# Patient Record
Sex: Female | Born: 1960
Health system: Southern US, Community
[De-identification: ages and names within clinical notes are randomized; demographics above are authoritative.]

## PROBLEM LIST (undated history)

## (undated) DIAGNOSIS — E079 Disorder of thyroid, unspecified: Secondary | ICD-10-CM

## (undated) DIAGNOSIS — E669 Obesity, unspecified: Secondary | ICD-10-CM

## (undated) DIAGNOSIS — E785 Hyperlipidemia, unspecified: Secondary | ICD-10-CM

## (undated) HISTORY — DX: Disorder of thyroid, unspecified: E07.9

## (undated) HISTORY — DX: Hyperlipidemia, unspecified: E78.5

## (undated) HISTORY — DX: Obesity, unspecified: E66.9

---

## 2000-12-10 ENCOUNTER — Other Ambulatory Visit: Admission: RE | Admit: 2000-12-10 | Discharge: 2000-12-10 | Payer: Self-pay | Admitting: Obstetrics and Gynecology

## 2001-06-19 ENCOUNTER — Emergency Department (HOSPITAL_COMMUNITY): Admission: EM | Admit: 2001-06-19 | Discharge: 2001-06-19 | Payer: Self-pay | Admitting: Emergency Medicine

## 2003-06-10 ENCOUNTER — Other Ambulatory Visit: Admission: RE | Admit: 2003-06-10 | Discharge: 2003-06-10 | Payer: Self-pay | Admitting: Obstetrics and Gynecology

## 2012-04-23 ENCOUNTER — Other Ambulatory Visit: Payer: Self-pay | Admitting: Family Medicine

## 2012-04-29 ENCOUNTER — Other Ambulatory Visit (HOSPITAL_COMMUNITY)
Admission: RE | Admit: 2012-04-29 | Discharge: 2012-04-29 | Disposition: A | Discharge status: Home / Self Care | Payer: 59 | Source: Ambulatory Visit | Attending: Family Medicine | Admitting: Family Medicine

## 2012-04-29 DIAGNOSIS — Z1159 Encounter for screening for other viral diseases: Secondary | ICD-10-CM | POA: Insufficient documentation

## 2012-04-29 DIAGNOSIS — Z124 Encounter for screening for malignant neoplasm of cervix: Secondary | ICD-10-CM | POA: Insufficient documentation

## 2012-08-10 ENCOUNTER — Other Ambulatory Visit (HOSPITAL_COMMUNITY): Payer: Self-pay | Admitting: Family Medicine

## 2012-08-10 DIAGNOSIS — Z1231 Encounter for screening mammogram for malignant neoplasm of breast: Secondary | ICD-10-CM

## 2012-08-27 ENCOUNTER — Ambulatory Visit (HOSPITAL_COMMUNITY)
Admission: RE | Admit: 2012-08-27 | Discharge: 2012-08-27 | Disposition: A | Discharge status: Home / Self Care | Payer: BC Managed Care – PPO | Source: Ambulatory Visit | Attending: Family Medicine | Admitting: Family Medicine

## 2012-08-27 DIAGNOSIS — Z1231 Encounter for screening mammogram for malignant neoplasm of breast: Secondary | ICD-10-CM

## 2012-09-01 ENCOUNTER — Other Ambulatory Visit: Payer: Self-pay | Admitting: Family Medicine

## 2012-09-01 DIAGNOSIS — R928 Other abnormal and inconclusive findings on diagnostic imaging of breast: Secondary | ICD-10-CM

## 2012-09-02 ENCOUNTER — Ambulatory Visit
Admission: RE | Admit: 2012-09-02 | Discharge: 2012-09-02 | Disposition: A | Discharge status: Home / Self Care | Payer: BC Managed Care – PPO | Source: Ambulatory Visit | Attending: Family Medicine | Admitting: Family Medicine

## 2012-09-02 ENCOUNTER — Other Ambulatory Visit: Payer: Self-pay | Admitting: Family Medicine

## 2012-09-02 DIAGNOSIS — R928 Other abnormal and inconclusive findings on diagnostic imaging of breast: Secondary | ICD-10-CM

## 2012-09-08 ENCOUNTER — Other Ambulatory Visit (HOSPITAL_COMMUNITY): Payer: Self-pay | Admitting: Radiology

## 2012-09-16 ENCOUNTER — Ambulatory Visit
Admission: RE | Admit: 2012-09-16 | Discharge: 2012-09-16 | Disposition: A | Discharge status: Home / Self Care | Payer: BC Managed Care – PPO | Source: Ambulatory Visit | Attending: Family Medicine | Admitting: Family Medicine

## 2012-09-16 DIAGNOSIS — R928 Other abnormal and inconclusive findings on diagnostic imaging of breast: Secondary | ICD-10-CM

## 2013-11-03 ENCOUNTER — Other Ambulatory Visit: Payer: Self-pay

## 2013-11-03 DIAGNOSIS — Z1231 Encounter for screening mammogram for malignant neoplasm of breast: Secondary | ICD-10-CM

## 2013-11-05 ENCOUNTER — Ambulatory Visit
Admission: RE | Admit: 2013-11-05 | Discharge: 2013-11-05 | Disposition: A | Discharge status: Home / Self Care | Payer: BC Managed Care – PPO | Source: Ambulatory Visit

## 2013-11-05 DIAGNOSIS — Z1231 Encounter for screening mammogram for malignant neoplasm of breast: Secondary | ICD-10-CM

## 2014-12-02 ENCOUNTER — Other Ambulatory Visit: Payer: Self-pay

## 2014-12-02 DIAGNOSIS — Z1231 Encounter for screening mammogram for malignant neoplasm of breast: Secondary | ICD-10-CM

## 2014-12-10 ENCOUNTER — Encounter: Payer: Self-pay | Admitting: *Deleted

## 2014-12-15 ENCOUNTER — Ambulatory Visit
Admission: RE | Admit: 2014-12-15 | Discharge: 2014-12-15 | Disposition: A | Discharge status: Home / Self Care | Payer: BC Managed Care – PPO | Source: Ambulatory Visit

## 2014-12-15 DIAGNOSIS — Z1231 Encounter for screening mammogram for malignant neoplasm of breast: Secondary | ICD-10-CM

## 2015-11-03 ENCOUNTER — Other Ambulatory Visit (HOSPITAL_COMMUNITY)
Admission: RE | Admit: 2015-11-03 | Discharge: 2015-11-03 | Disposition: A | Discharge status: Home / Self Care | Payer: BLUE CROSS/BLUE SHIELD | Source: Ambulatory Visit | Attending: Family Medicine | Admitting: Family Medicine

## 2015-11-03 ENCOUNTER — Other Ambulatory Visit: Payer: Self-pay | Admitting: Family Medicine

## 2015-11-03 DIAGNOSIS — Z01411 Encounter for gynecological examination (general) (routine) with abnormal findings: Secondary | ICD-10-CM | POA: Insufficient documentation

## 2015-11-06 LAB — CYTOLOGY - PAP

## 2016-07-03 ENCOUNTER — Other Ambulatory Visit: Payer: Self-pay | Admitting: Family Medicine

## 2016-07-03 DIAGNOSIS — N63 Unspecified lump in unspecified breast: Secondary | ICD-10-CM

## 2016-07-05 ENCOUNTER — Ambulatory Visit
Admission: RE | Admit: 2016-07-05 | Discharge: 2016-07-05 | Disposition: A | Discharge status: Home / Self Care | Payer: 59 | Source: Ambulatory Visit | Attending: Family Medicine | Admitting: Family Medicine

## 2016-07-05 DIAGNOSIS — N63 Unspecified lump in unspecified breast: Secondary | ICD-10-CM

## 2017-03-26 DIAGNOSIS — M25532 Pain in left wrist: Secondary | ICD-10-CM | POA: Diagnosis not present

## 2017-03-26 DIAGNOSIS — M654 Radial styloid tenosynovitis [de Quervain]: Secondary | ICD-10-CM | POA: Diagnosis not present

## 2017-06-24 DIAGNOSIS — R0981 Nasal congestion: Secondary | ICD-10-CM | POA: Diagnosis not present

## 2017-06-24 DIAGNOSIS — J029 Acute pharyngitis, unspecified: Secondary | ICD-10-CM | POA: Diagnosis not present

## 2017-06-24 DIAGNOSIS — R0982 Postnasal drip: Secondary | ICD-10-CM | POA: Diagnosis not present

## 2017-12-15 DIAGNOSIS — Z1159 Encounter for screening for other viral diseases: Secondary | ICD-10-CM | POA: Diagnosis not present

## 2017-12-15 DIAGNOSIS — Z0000 Encounter for general adult medical examination without abnormal findings: Secondary | ICD-10-CM | POA: Diagnosis not present

## 2017-12-15 DIAGNOSIS — E7800 Pure hypercholesterolemia, unspecified: Secondary | ICD-10-CM | POA: Diagnosis not present

## 2018-03-26 ENCOUNTER — Emergency Department (HOSPITAL_COMMUNITY): Payer: 59

## 2018-03-26 ENCOUNTER — Emergency Department (HOSPITAL_COMMUNITY)
Admission: EM | Admit: 2018-03-26 | Discharge: 2018-03-26 | Disposition: A | Discharge status: Home / Self Care | Payer: 59 | Attending: Emergency Medicine | Admitting: Emergency Medicine

## 2018-03-26 ENCOUNTER — Encounter (HOSPITAL_COMMUNITY): Payer: Self-pay | Admitting: Radiology

## 2018-03-26 DIAGNOSIS — R112 Nausea with vomiting, unspecified: Secondary | ICD-10-CM | POA: Insufficient documentation

## 2018-03-26 DIAGNOSIS — Z79899 Other long term (current) drug therapy: Secondary | ICD-10-CM | POA: Insufficient documentation

## 2018-03-26 DIAGNOSIS — Z7982 Long term (current) use of aspirin: Secondary | ICD-10-CM | POA: Insufficient documentation

## 2018-03-26 DIAGNOSIS — R109 Unspecified abdominal pain: Secondary | ICD-10-CM | POA: Diagnosis not present

## 2018-03-26 LAB — CBC WITH DIFFERENTIAL/PLATELET
BASOS ABS: 0.1 10*3/uL (ref 0.0–0.1)
BASOS PCT: 1 %
EOS PCT: 6 %
Eosinophils Absolute: 0.4 10*3/uL (ref 0.0–0.7)
HCT: 41.8 % (ref 36.0–46.0)
Hemoglobin: 13.9 g/dL (ref 12.0–15.0)
Lymphocytes Relative: 27 %
Lymphs Abs: 1.9 10*3/uL (ref 0.7–4.0)
MCH: 27.7 pg (ref 26.0–34.0)
MCHC: 33.3 g/dL (ref 30.0–36.0)
MCV: 83.4 fL (ref 78.0–100.0)
MONO ABS: 0.7 10*3/uL (ref 0.1–1.0)
Monocytes Relative: 10 %
Neutro Abs: 4 10*3/uL (ref 1.7–7.7)
Neutrophils Relative %: 56 %
PLATELETS: 221 10*3/uL (ref 150–400)
RBC: 5.01 MIL/uL (ref 3.87–5.11)
RDW: 13.7 % (ref 11.5–15.5)
WBC: 7 10*3/uL (ref 4.0–10.5)

## 2018-03-26 LAB — URINALYSIS, ROUTINE W REFLEX MICROSCOPIC
BILIRUBIN URINE: NEGATIVE
GLUCOSE, UA: NEGATIVE mg/dL
Hgb urine dipstick: NEGATIVE
KETONES UR: NEGATIVE mg/dL
LEUKOCYTES UA: NEGATIVE
NITRITE: NEGATIVE
PROTEIN: NEGATIVE mg/dL
Specific Gravity, Urine: 1.011 (ref 1.005–1.030)
pH: 9 — ABNORMAL HIGH (ref 5.0–8.0)

## 2018-03-26 LAB — BASIC METABOLIC PANEL
Anion gap: 10 (ref 5–15)
BUN: 15 mg/dL (ref 6–20)
CALCIUM: 9.5 mg/dL (ref 8.9–10.3)
CO2: 26 mmol/L (ref 22–32)
Chloride: 105 mmol/L (ref 101–111)
Creatinine, Ser: 0.79 mg/dL (ref 0.44–1.00)
GFR calc Af Amer: >60 mL/min (ref >60)
Glucose, Bld: 102 mg/dL — ABNORMAL HIGH (ref 65–99)
Potassium: 3.6 mmol/L (ref 3.5–5.1)
Sodium: 141 mmol/L (ref 135–145)

## 2018-03-26 MED ORDER — SODIUM CHLORIDE 0.9 % IV BOLUS
1000.0000 mL | Freq: Once | INTRAVENOUS | Status: AC
  Administered 2018-03-26: 1000 mL via INTRAVENOUS

## 2018-03-26 MED ORDER — ONDANSETRON HCL 4 MG/2ML IJ SOLN
4.0000 mg | Freq: Once | INTRAMUSCULAR | Status: AC
  Administered 2018-03-26: 4 mg via INTRAVENOUS
  Filled 2018-03-26: qty 2

## 2018-03-26 MED ORDER — MORPHINE SULFATE (PF) 4 MG/ML IV SOLN
4.0000 mg | Freq: Once | INTRAVENOUS | Status: AC
  Administered 2018-03-26: 4 mg via INTRAVENOUS
  Filled 2018-03-26: qty 1

## 2018-03-26 MED ORDER — KETOROLAC TROMETHAMINE 30 MG/ML IJ SOLN
30.0000 mg | Freq: Once | INTRAMUSCULAR | Status: AC
  Administered 2018-03-26: 30 mg via INTRAVENOUS
  Filled 2018-03-26: qty 1

## 2018-03-26 MED ORDER — DIAZEPAM 5 MG PO TABS: 5.0000 mg | ORAL_TABLET | Freq: Four times a day (QID) | ORAL | 0 refills | Status: AC | PRN

## 2018-03-26 NOTE — Discharge Instructions (Addendum)
Try using heat on the sore area 3 or 4 times a day. For pain, use Motrin or Tylenol.

## 2018-03-26 NOTE — ED Provider Notes (Signed)
She feels fairly comfortable at this time.  Findings discussed with patient and son, all questions answered.  They report that she frequently has pain like this, every year, at this time.  Patient requests "muscle relaxer," for her discomfort.  She was instructed by me verbally to avoid driving when she takes the muscle relaxer.  She is advised to use an over-the-counter medication such as Tylenol or Motrin for pain.  Also advised to follow-up with PCP if not better in 3 or 4 days.   Mancel BaleWentz, Deva Ron, MD 03/26/18 [id number]

## 2018-03-26 NOTE — ED Triage Notes (Signed)
Pt BIB [facility] from home. Pt c/o left flank pain that radiates to her lower left abdomen. Pt denies urinary symptoms. Pain started yesterday and was intermittent. She woke up this morning and the pain was so bad she couldn't hardly move. Pt ambulatory on scene.

## 2018-03-26 NOTE — ED Provider Notes (Signed)
[facility]-EMERGENCY DEPT Provider Note   CSN: [id number] Arrival date & time: 03/26/18  4098     History   Chief Complaint Chief Complaint  Patient presents with  . Flank Pain    HPI Meghan Hill is a 57 y.o. female.  The history is provided by the patient.  She has history of hyperlipidemia, and comes in complaining of left flank pain for the last several days.  Pain was intermittent, but woke her up at 4 AM and was very severe.  There is associated nausea and vomiting.  Nothing made the pain better, nothing made it worse.  She has not taken anything for it.  She denies any fever or chills and denies urinary difficulty.  She has not had pain like this before.  Past Medical History:  Diagnosis Date  . Hyperlipidemia   . Obesity   . Thyroid disease     There are no active problems to display for this patient.   No past surgical history on file.   OB History   None      Home Medications    Prior to Admission medications   Medication Sig Start Date End Date Taking? Authorizing Provider  aspirin 81 MG chewable tablet Chew 81 mg by mouth daily.    Yes [provider]  Calcium Carbonate (CALCIUM 600) 1500 MG TABS Take 1,500 mg by mouth daily.    Yes [provider]  ibuprofen (ADVIL,MOTRIN) 200 MG tablet Take 400 mg by mouth every 6 (six) hours as needed for moderate pain.   Yes [provider]  Multiple Vitamins-Minerals (MULTIVITAMIN PO) Take 1 tablet by mouth daily.    Yes [provider]  Omega-3 Fatty Acids (FISH OIL) 1000 MG CAPS Take 1 capsule by mouth daily.    Yes [provider]    Family History Family History  Problem Relation Age of Onset  . Hypertension Mother   . Alzheimer's disease Father   . Hyperlipidemia Sister   . Asthma Sister     Social History Social History   Tobacco Use  . Smoking status: Never Smoker  Substance Use Topics  . Alcohol use: No  . Drug use: No      Allergies   Patient has no known allergies.   Review of Systems Review of Systems  All other systems reviewed and are negative.    Physical Exam Updated Vital Signs BP 138/66 (BP Location: Left Arm)   Pulse 81   Temp 98.9 F (37.2 C) (Oral)   Resp 18   SpO2 100%   Physical Exam  Nursing note and vitals reviewed.  57 year old female, appears uncomfortable and is constantly shifting in position, but is in no acute distress. Vital signs are normal. Oxygen saturation is 100%, which is normal. Head is normocephalic and atraumatic. PERRLA, EOMI. Oropharynx is clear. Neck is nontender and supple without adenopathy or JVD. Back is nontender and there is no CVA tenderness. Lungs are clear without rales, wheezes, or rhonchi. Chest is nontender. Heart has regular rate and rhythm without murmur. Abdomen is soft, flat, nontender without masses or hepatosplenomegaly and peristalsis is hypactive. Extremities have no cyanosis or edema, full range of motion is present. Skin is warm and dry without rash. Neurologic: Mental status is normal, cranial nerves are intact, there are no motor or sensory deficits.  ED Treatments / Results  Labs (all labs ordered are listed, but only abnormal results are displayed) Labs Reviewed  BASIC METABOLIC  PANEL - Abnormal; Notable for the following components:      Result Value   Glucose, Bld 102 (*)    All other components within normal limits  CBC WITH DIFFERENTIAL/PLATELET  URINALYSIS, ROUTINE W REFLEX MICROSCOPIC   Radiology Ct Renal Stone Study  Result Date: 03/26/2018 CLINICAL DATA:  Left flank pain EXAM: CT ABDOMEN AND PELVIS WITHOUT CONTRAST TECHNIQUE: Multidetector CT imaging of the abdomen and pelvis was performed following the standard protocol without oral or IV contrast. COMPARISON:  None. FINDINGS: Lower chest: Lung bases are clear. Hepatobiliary: No focal liver lesions are evident on this noncontrast enhanced study. Gallbladder wall  is not appreciably thickened. There is no biliary duct dilatation. Pancreas: There is no pancreatic mass or inflammatory focus. Spleen: No splenic lesions are evident. Adrenals/Urinary Tract: Adrenals bilaterally appear unremarkable. Kidneys bilaterally show no evident mass or hydronephrosis on either side. There is no appreciable renal or ureteral calculus on either side. Urinary bladder is midline with wall thickness within normal limits. Stomach/Bowel: There is no appreciable bowel wall or mesenteric thickening. No evident bowel obstruction. No free air or portal venous air. Vascular/Lymphatic: There is no abdominal aortic aneurysm. No vascular lesions are evident. No adenopathy is appreciable in the abdomen or pelvis. Reproductive: Uterus is anteverted. Uterus has a somewhat globular appearance suggesting potential underlying leiomyomatous change. No extrauterine pelvic mass. Other: Appendix appears normal. There is no ascites or abscess in the abdomen or pelvis. There is a small ventral hernia containing only fat. Musculoskeletal: There is degenerative change in the lumbar spine. There are no blastic or lytic bone lesions. No intramuscular or abdominal wall lesion. IMPRESSION: 1. Uterus has a somewhat globular appearance. Question underlying leiomyomatous change. 2. No renal or ureteral calculus evident. No hydronephrosis on either side. 3.  No bowel obstruction.  No abscess.  Appendix appears normal. 4.  Small ventral hernia containing only fat. Electronically Signed   By: Bretta BangWilliam  Woodruff III M.D.   On: 03/26/2018 07:59    Procedures Procedures (including critical care time)  Medications Ordered in ED Medications  sodium chloride 0.9 % bolus 1,000 mL (has no administration in time range)  ketorolac (TORADOL) 30 MG/ML injection 30 mg (has no administration in time range)  ondansetron (ZOFRAN) injection 4 mg (has no administration in time range)  morphine 4 MG/ML injection 4 mg (has no  administration in time range)     Initial Impression / Assessment and Plan / ED Course  I have reviewed the triage vital signs and the nursing notes.  Pertinent labs & imaging results that were available during my care of the patient were reviewed by me and considered in my medical decision making (see chart for details).  Flank pain suggestive of renal colic.  She is given IV fluids, ketorolac, ondansetron, morphine and will be sent for renal stone protocol CT scan.  Old records are reviewed, and she has no relevant past visits.  She had some improvement of pain with above-noted treatment, but pain has recurred following return from CT scan.  CT shows no evidence of urolithiasis.  Urinalysis is pending.  She will be given an additional dose of morphine.  Case is signed out to Dr. Effie ShyWentz.  Final Clinical Impressions(s) / ED Diagnoses   Final diagnoses:  Left flank pain    ED Discharge Orders    None       Dione BoozeGlick, Traylon Schimming, MD 03/26/18 [phone]

## 2018-03-26 NOTE — ED Notes (Signed)
Bed: [id number] Expected date:  Expected time:  Means of arrival:  Comments: EMS- abdominal pain/back pain

## 2018-03-27 DIAGNOSIS — N23 Unspecified renal colic: Secondary | ICD-10-CM | POA: Diagnosis not present

## 2018-07-16 DIAGNOSIS — H16223 Keratoconjunctivitis sicca, not specified as Sjogren's, bilateral: Secondary | ICD-10-CM | POA: Diagnosis not present

## 2018-12-28 IMAGING — CT CT RENAL STONE PROTOCOL
2 of 4 series · 16 of 46 positions shown, 18 images · non-contrast
Comparison: None.

CLINICAL DATA: Left flank pain

EXAM:
CT ABDOMEN AND PELVIS WITHOUT CONTRAST
TECHNIQUE: Multidetector CT imaging of the abdomen and pelvis was performed
following the standard protocol without oral or IV contrast.

[Series 2: axial st · axial · 0.76mm/px · z∈[-506,-102]mm · 13 of 93 slices shown, 15 images]
[im 6/93  soft-tissue]
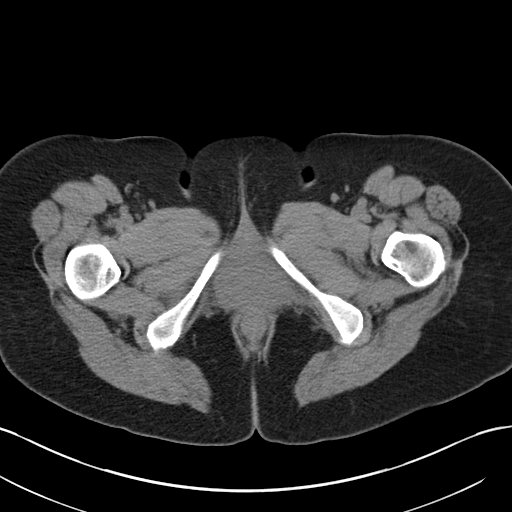
[im 6/93  bone]
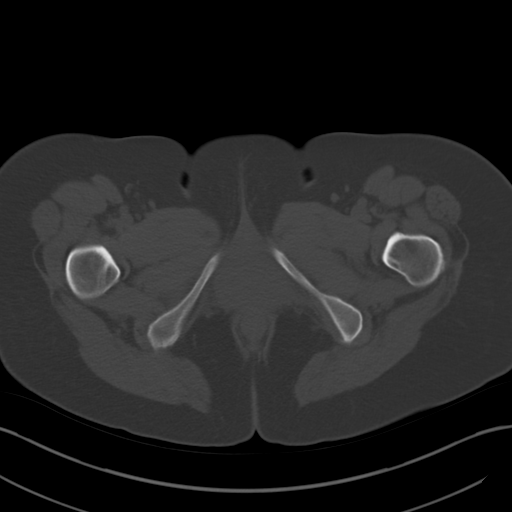
[im 11/93  soft-tissue]
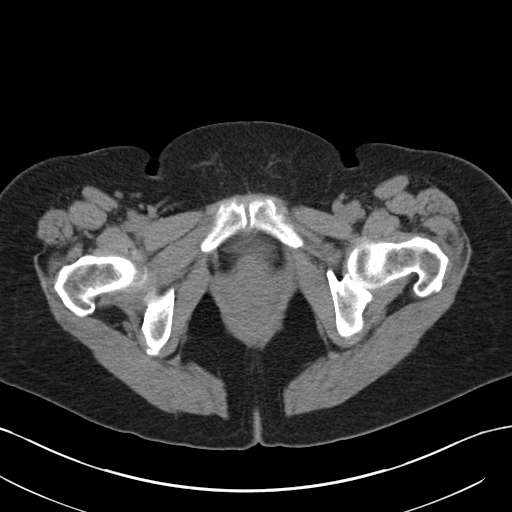
[im 22/93  soft-tissue]
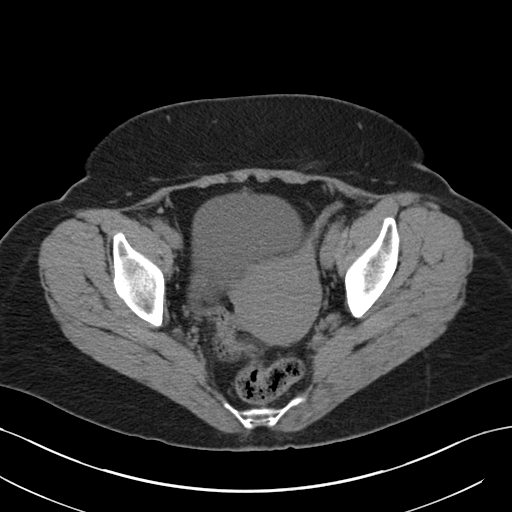
[im 28/93  soft-tissue]
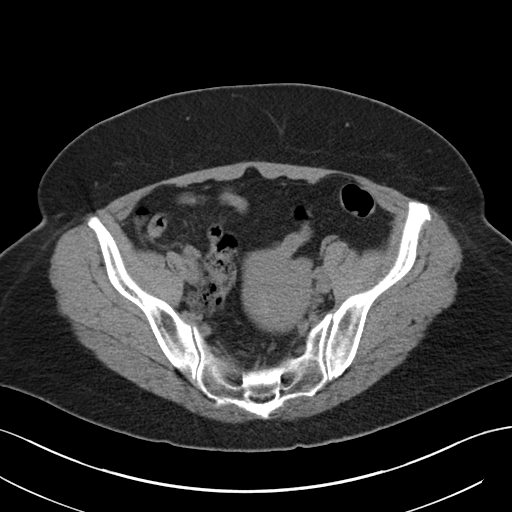
[im 33/93  soft-tissue]
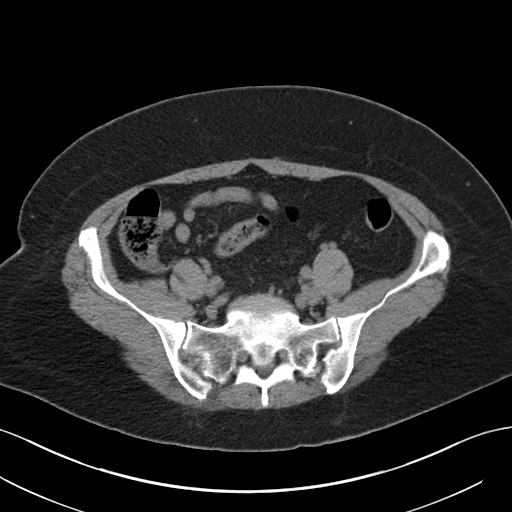
[im 38/93  soft-tissue]
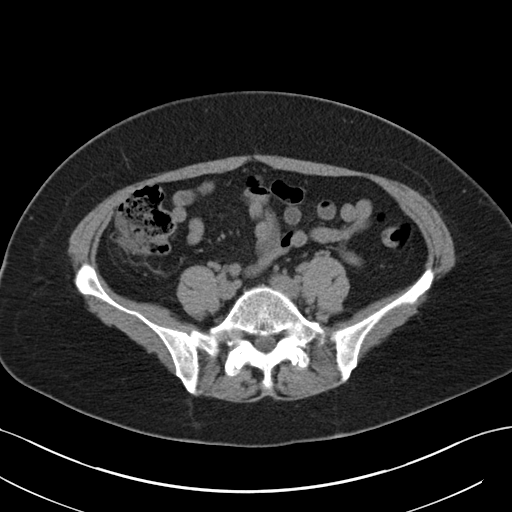
[im 49/93  soft-tissue]
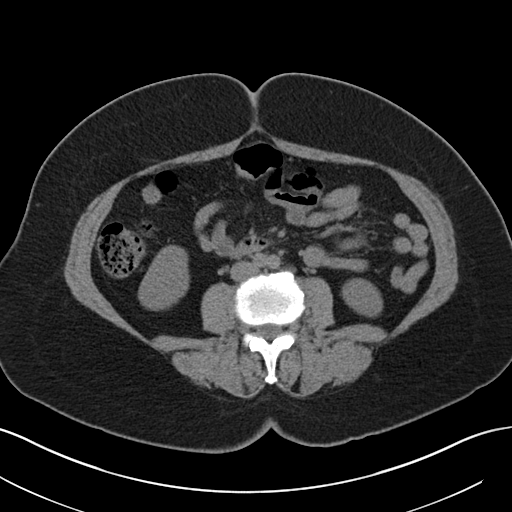
[im 55/93  soft-tissue]
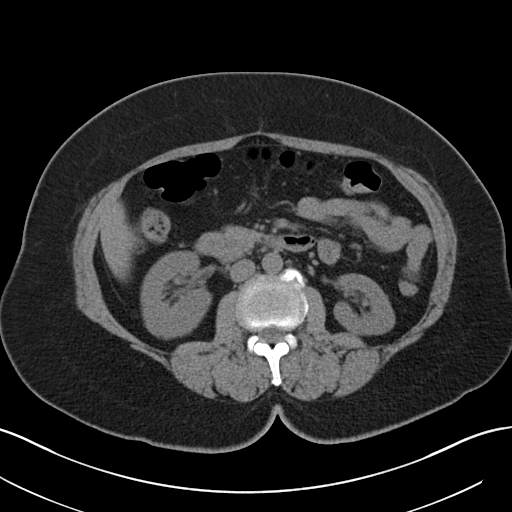
[im 60/93  soft-tissue]
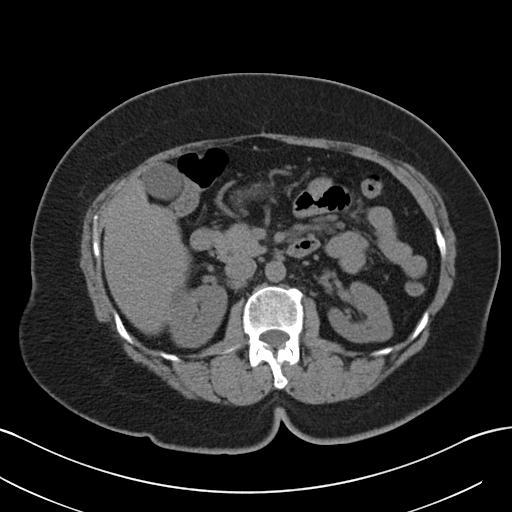
[im 60/93  bone]
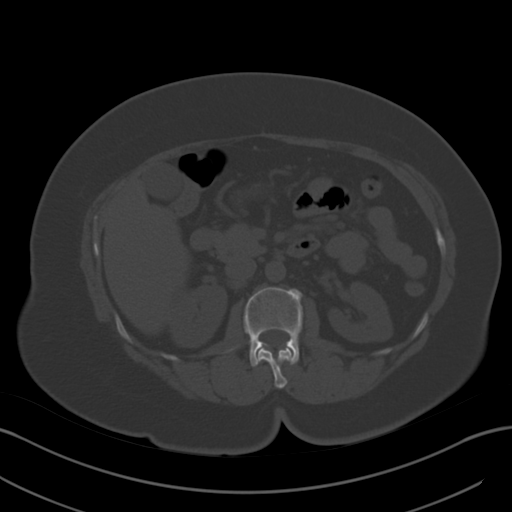
[im 65/93  soft-tissue]
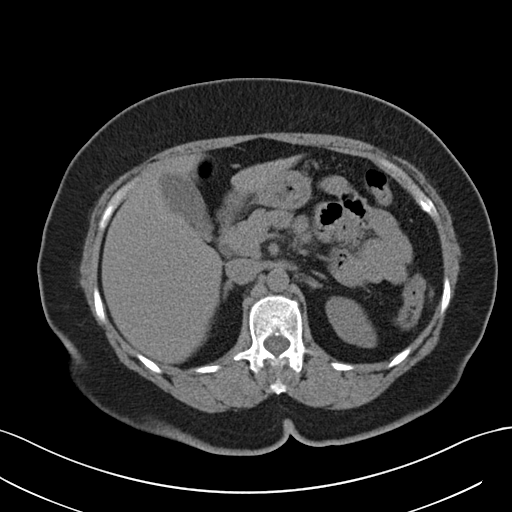
[im 71/93  soft-tissue]
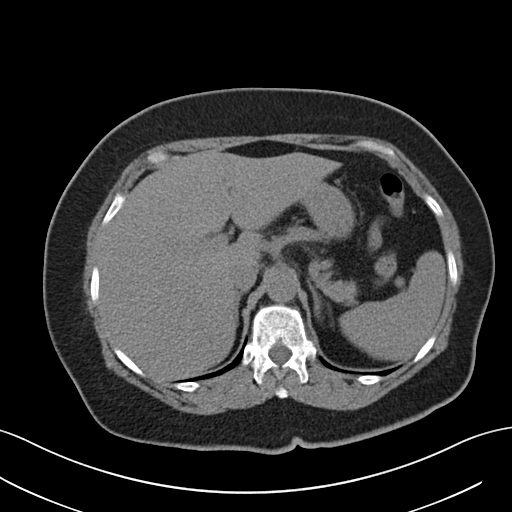
[im 82/93  soft-tissue]
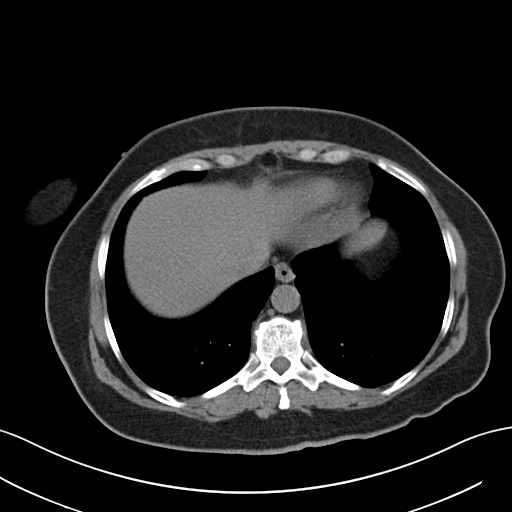
[im 87/93  soft-tissue]
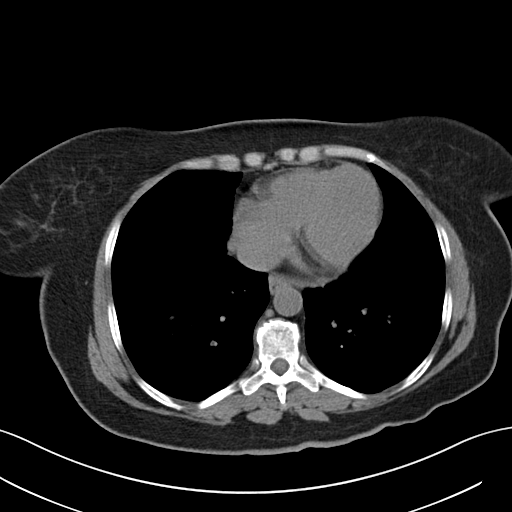

[Series 5: coronal · coronal · 0.87mm/px · 3 of 169 slices shown]
[im 57/169  soft-tissue]
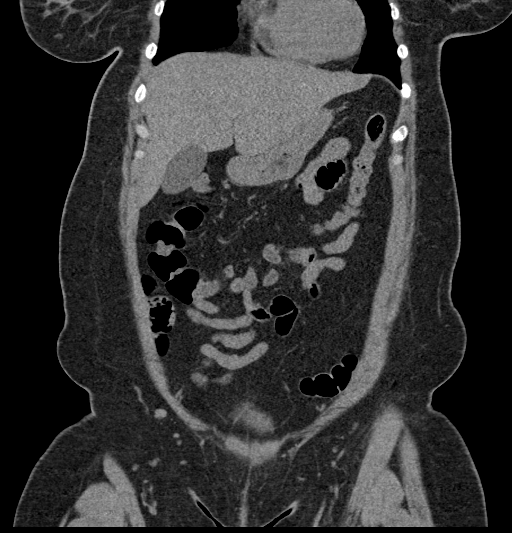
[im 75/169  soft-tissue]
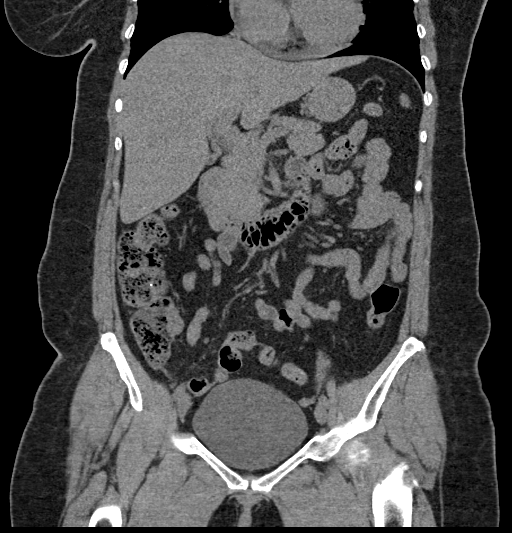
[im 94/169  soft-tissue]
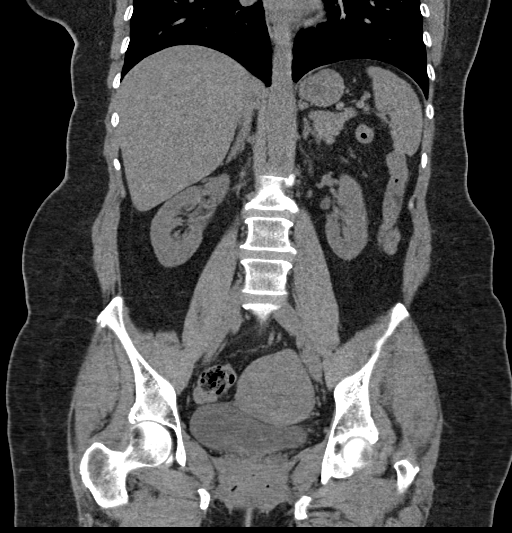

[16 of 46 positions shown; findings below may reference images not displayed]

FINDINGS: Lower chest: Lung bases are clear.

Hepatobiliary: No focal liver lesions are evident on this
noncontrast enhanced study. Gallbladder wall is not appreciably
thickened. There is no biliary duct dilatation.

Pancreas: There is no pancreatic mass or inflammatory focus.

Spleen: No splenic lesions are evident.

Adrenals/Urinary Tract: Adrenals bilaterally appear unremarkable.
Kidneys bilaterally show no evident mass or hydronephrosis on either
side. There is no appreciable renal or ureteral calculus on either
side. Urinary bladder is midline with wall thickness within normal
limits.

Stomach/Bowel: There is no appreciable bowel wall or mesenteric
thickening. No evident bowel obstruction. No free air or portal
venous air.

Vascular/Lymphatic: There is no abdominal aortic aneurysm. No
vascular lesions are evident. No adenopathy is appreciable in the
abdomen or pelvis.

Reproductive: Uterus is anteverted. Uterus has a somewhat globular
appearance suggesting potential underlying leiomyomatous change. No
extrauterine pelvic mass.

Other: Appendix appears normal. There is no ascites or abscess in
the abdomen or pelvis. There is a small ventral hernia containing
only fat.

Musculoskeletal: There is degenerative change in the lumbar spine.
There are no blastic or lytic bone lesions. No intramuscular or
abdominal wall lesion.
IMPRESSION: 1. Uterus has a somewhat globular appearance. Question underlying
leiomyomatous change.

2. No renal or ureteral calculus evident. No hydronephrosis on
either side.

3.  No bowel obstruction.  No abscess.  Appendix appears normal.

4.  Small ventral hernia containing only fat.

## 2019-01-28 ENCOUNTER — Other Ambulatory Visit (HOSPITAL_COMMUNITY)
Admission: RE | Admit: 2019-01-28 | Discharge: 2019-01-28 | Disposition: A | Discharge status: Home / Self Care | Payer: 59 | Source: Ambulatory Visit | Attending: Family Medicine | Admitting: Family Medicine

## 2019-01-28 ENCOUNTER — Other Ambulatory Visit: Payer: Self-pay | Admitting: Family Medicine

## 2019-01-28 DIAGNOSIS — Z01411 Encounter for gynecological examination (general) (routine) with abnormal findings: Secondary | ICD-10-CM | POA: Insufficient documentation

## 2019-01-28 DIAGNOSIS — E7800 Pure hypercholesterolemia, unspecified: Secondary | ICD-10-CM | POA: Diagnosis not present

## 2019-01-28 DIAGNOSIS — Z1211 Encounter for screening for malignant neoplasm of colon: Secondary | ICD-10-CM | POA: Diagnosis not present

## 2019-01-28 DIAGNOSIS — Z0000 Encounter for general adult medical examination without abnormal findings: Secondary | ICD-10-CM | POA: Diagnosis not present

## 2019-01-28 DIAGNOSIS — Z23 Encounter for immunization: Secondary | ICD-10-CM | POA: Diagnosis not present

## 2019-01-28 DIAGNOSIS — E559 Vitamin D deficiency, unspecified: Secondary | ICD-10-CM | POA: Diagnosis not present

## 2019-02-01 LAB — CYTOLOGY - PAP
DIAGNOSIS: NEGATIVE
HPV (WINDOPATH): NOT DETECTED

## 2020-07-05 ENCOUNTER — Other Ambulatory Visit: Payer: Self-pay | Admitting: Family Medicine

## 2020-07-05 DIAGNOSIS — Z1231 Encounter for screening mammogram for malignant neoplasm of breast: Secondary | ICD-10-CM

## 2020-07-25 ENCOUNTER — Other Ambulatory Visit: Payer: Self-pay

## 2020-07-25 ENCOUNTER — Ambulatory Visit
Admission: RE | Admit: 2020-07-25 | Discharge: 2020-07-25 | Disposition: A | Discharge status: Home / Self Care | Payer: 59 | Source: Ambulatory Visit | Attending: Family Medicine | Admitting: Family Medicine

## 2020-07-25 DIAGNOSIS — Z1231 Encounter for screening mammogram for malignant neoplasm of breast: Secondary | ICD-10-CM

## 2020-12-28 ENCOUNTER — Other Ambulatory Visit: Payer: Self-pay | Admitting: Orthopedic Surgery

## 2020-12-28 DIAGNOSIS — M542 Cervicalgia: Secondary | ICD-10-CM

## 2020-12-31 ENCOUNTER — Ambulatory Visit
Admission: RE | Admit: 2020-12-31 | Discharge: 2020-12-31 | Disposition: A | Discharge status: Home / Self Care | Payer: 59 | Source: Ambulatory Visit | Attending: Orthopedic Surgery | Admitting: Orthopedic Surgery

## 2020-12-31 ENCOUNTER — Other Ambulatory Visit: Payer: Self-pay

## 2020-12-31 DIAGNOSIS — M542 Cervicalgia: Secondary | ICD-10-CM

## 2021-12-10 ENCOUNTER — Other Ambulatory Visit: Payer: Self-pay | Admitting: Family Medicine

## 2021-12-10 DIAGNOSIS — Z1231 Encounter for screening mammogram for malignant neoplasm of breast: Secondary | ICD-10-CM

## 2021-12-11 ENCOUNTER — Ambulatory Visit
Admission: RE | Admit: 2021-12-11 | Discharge: 2021-12-11 | Disposition: A | Discharge status: Home / Self Care | Payer: 59 | Source: Ambulatory Visit | Attending: Family Medicine | Admitting: Family Medicine

## 2021-12-11 DIAGNOSIS — Z1231 Encounter for screening mammogram for malignant neoplasm of breast: Secondary | ICD-10-CM

## 2023-03-14 ENCOUNTER — Other Ambulatory Visit (HOSPITAL_COMMUNITY)
Admission: RE | Admit: 2023-03-14 | Discharge: 2023-03-14 | Disposition: A | Discharge status: Home / Self Care | Payer: BLUE CROSS/BLUE SHIELD | Source: Ambulatory Visit | Attending: Family Medicine | Admitting: Family Medicine

## 2023-03-14 ENCOUNTER — Other Ambulatory Visit: Payer: Self-pay | Admitting: Family Medicine

## 2023-03-14 DIAGNOSIS — Z01411 Encounter for gynecological examination (general) (routine) with abnormal findings: Secondary | ICD-10-CM | POA: Insufficient documentation

## 2023-03-18 LAB — CYTOLOGY - PAP
Comment: NEGATIVE
Diagnosis: NEGATIVE
High risk HPV: NEGATIVE
